# Patient Record
Sex: Male | Born: 1945 | Race: Black or African American | Hispanic: No | Smoking: Current every day smoker
Health system: Southern US, Community
[De-identification: ages and names within clinical notes are randomized; demographics above are authoritative.]

## PROBLEM LIST (undated history)

## (undated) DIAGNOSIS — R319 Hematuria, unspecified: Secondary | ICD-10-CM

## (undated) DIAGNOSIS — I82409 Acute embolism and thrombosis of unspecified deep veins of unspecified lower extremity: Secondary | ICD-10-CM

## (undated) DIAGNOSIS — I10 Essential (primary) hypertension: Secondary | ICD-10-CM

## (undated) HISTORY — DX: Essential (primary) hypertension: I10

## (undated) HISTORY — DX: Acute embolism and thrombosis of unspecified deep veins of unspecified lower extremity: I82.409

## (undated) HISTORY — DX: Hematuria, unspecified: R31.9

---

## 1996-10-04 HISTORY — PX: TOTAL KNEE ARTHROPLASTY: SHX125

## 2012-08-08 ENCOUNTER — Telehealth: Payer: Self-pay

## 2012-08-08 NOTE — Telephone Encounter (Signed)
Called and spoke to pt's daughter, Aniceto Boss, who does all of his scheduling. She said pt has family hx of colon cancer in his brother. She is going to check with him and call back to schedule the OV appt.

## 2012-08-09 NOTE — Telephone Encounter (Signed)
Pt has appt on 09/07/2012 at 1:30 PM.

## 2012-09-07 ENCOUNTER — Ambulatory Visit: Payer: Self-pay | Admitting: Gastroenterology

## 2012-10-04 HISTORY — PX: HERNIA REPAIR: SHX51

## 2013-10-04 HISTORY — PX: HERNIA REPAIR: SHX51

## 2014-10-04 HISTORY — PX: GALLBLADDER SURGERY: SHX652

## 2015-12-29 ENCOUNTER — Other Ambulatory Visit: Payer: Self-pay | Admitting: Nephrology

## 2015-12-29 DIAGNOSIS — N183 Chronic kidney disease, stage 3 unspecified: Secondary | ICD-10-CM

## 2015-12-29 DIAGNOSIS — R809 Proteinuria, unspecified: Secondary | ICD-10-CM

## 2016-01-02 ENCOUNTER — Ambulatory Visit
Admission: RE | Admit: 2016-01-02 | Discharge: 2016-01-02 | Disposition: A | Discharge status: Home / Self Care | Payer: Medicare Other | Source: Ambulatory Visit | Attending: Nephrology | Admitting: Nephrology

## 2016-01-02 DIAGNOSIS — N183 Chronic kidney disease, stage 3 unspecified: Secondary | ICD-10-CM

## 2016-01-02 DIAGNOSIS — N289 Disorder of kidney and ureter, unspecified: Secondary | ICD-10-CM | POA: Insufficient documentation

## 2016-01-02 DIAGNOSIS — R319 Hematuria, unspecified: Secondary | ICD-10-CM | POA: Diagnosis present

## 2016-01-02 DIAGNOSIS — R809 Proteinuria, unspecified: Secondary | ICD-10-CM | POA: Insufficient documentation

## 2016-03-08 ENCOUNTER — Ambulatory Visit (INDEPENDENT_AMBULATORY_CARE_PROVIDER_SITE_OTHER): Payer: Medicare Other | Admitting: Urology

## 2016-03-08 ENCOUNTER — Encounter: Payer: Self-pay | Admitting: Urology

## 2016-03-08 VITALS — BP 165/70 | HR 64 | Ht 69.0 in | Wt 169.3 lb

## 2016-03-08 DIAGNOSIS — R3129 Other microscopic hematuria: Secondary | ICD-10-CM

## 2016-03-08 DIAGNOSIS — N401 Enlarged prostate with lower urinary tract symptoms: Secondary | ICD-10-CM

## 2016-03-08 DIAGNOSIS — N138 Other obstructive and reflux uropathy: Secondary | ICD-10-CM

## 2016-03-08 NOTE — Progress Notes (Signed)
03/08/2016 3:09 PM   Jeffrey Benson 1946-04-08 [id number]  Referring provider: Abran Richard, PA-C [address] [place], [place] [address]  Chief Complaint  Patient presents with  . Hematuria    referred by Dr. in [place]    HPI: Patient is a 70 -year-old African American who presents today as a referral from their nephrologist, Dr. Lamont Dowdy, for microscopic hematuria.  Patient was found to have microscopic hematuria on 12/25/2015 with 11-30 RBC's/hpf.  Patient doesn't have a prior history of microscopic hematuria.    He does not have a prior history of recurrent urinary tract infections, nephrolithiasis, trauma to the genitourinary tract, BPH or malignancies of the genitourinary tract.   He does not have a family medical history of nephrolithiasis, malignancies of the genitourinary tract or hematuria.   Today, he is not having symptoms of frequent urination, urgency, dysuria, nocturia, incontinence, hesitancy, intermittency, straining to urinate or a weak urinary stream.   His UA today demonstrates 3-10 RBC's/hpf.    He is not experiencing any suprapubic pain, abdominal pain or flank pain.  He denies any recent fevers, chills, nausea or vomiting.   He had a renal ultrasound on 01/02/2016 which demonstrated medical renal disease and nonspecific bladder wall thickening.    He is a smoker with a 1 ppd history for 50 years.  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    PMH: Past Medical History  Diagnosis Date  . DVT of leg (deep venous thrombosis) (HCC)   . HTN (hypertension)   . Hematuria     Surgical History: Past Surgical History  Procedure Laterality Date  . Total knee arthroplasty  1998  . Hernia repair Right 2015  . Hernia repair Left 2014  . Gallbladder surgery  2016    Home Medications:    Medication List       This list is accurate as of: 03/08/16  3:09 PM.  Always use your most recent med list.               amLODipine 10 MG  tablet  Commonly known as:  NORVASC     metoprolol 100 MG tablet  Commonly known as:  LOPRESSOR     VITAMIN D PO  Take by mouth daily.     warfarin 1 MG tablet  Commonly known as:  COUMADIN  Take 5 mg by mouth.     warfarin 4 MG tablet  Commonly known as:  COUMADIN        Allergies: No Known Allergies  Family History: Family History  Problem Relation Age of Onset  . Kidney disease Neg Hx   . Prostate cancer Neg Hx     Social History:  reports that he has been smoking.  He does not have any smokeless tobacco history on file. He reports that he does not drink alcohol or use illicit drugs.  ROS: UROLOGY Frequent Urination?: No Hard to postpone urination?: No Burning/pain with urination?: No Get up at night to urinate?: No Leakage of urine?: No Urine stream starts and stops?: No Trouble starting stream?: No Do you have to strain to urinate?: No Blood in urine?: No Urinary tract infection?: No Sexually transmitted disease?: No Injury to kidneys or bladder?: No Painful intercourse?: No Weak stream?: No Erection problems?: No Penile pain?: No  Gastrointestinal Nausea?: No Vomiting?: No Indigestion/heartburn?: No Diarrhea?: No Constipation?: No  Constitutional Fever: No Night sweats?: No Weight loss?: No Fatigue?: No  Skin Skin rash/lesions?: No  Itching?: No  Eyes Blurred vision?: No Double vision?: No  Ears/Nose/Throat Sore throat?: No Sinus problems?: No  Hematologic/Lymphatic Swollen glands?: No Easy bruising?: No  Cardiovascular Leg swelling?: Yes Chest pain?: No  Respiratory Cough?: No Shortness of breath?: No  Endocrine Excessive thirst?: No  Musculoskeletal Back pain?: No Joint pain?: Yes  Neurological Headaches?: No Dizziness?: No  Psychologic Depression?: No Anxiety?: No  Physical Exam: BP 165/70 mmHg  Pulse 64  Ht 5\' 9"  (1.753 m)  Wt 169 lb 4.8 oz (76.794 kg)  BMI 24.99 kg/m2  Constitutional: Well nourished.  Alert and oriented, No acute distress. HEENT: [place] AT, moist mucus membranes. Trachea midline, no masses. Cardiovascular: No clubbing, cyanosis, or edema. Respiratory: Normal respiratory effort, no increased work of breathing. GI: Abdomen is soft, non tender, non distended, no abdominal masses. Liver and spleen not palpable.  No hernias appreciated.  Stool sample for occult testing is not indicated.   GU: No CVA tenderness.  No bladder fullness or masses.  Patient with uncircumcised phallus. Foreskin easily retracted with vitiligo.  Urethral meatus is patent.  No penile discharge. No penile lesions or rashes. Scrotum without lesions, cysts, rashes and/or edema.  Testicles are located scrotally bilaterally. No masses are appreciated in the testicles. Left and right epididymis are normal. Rectal: Patient with  normal sphincter tone. Anus and perineum without scarring or rashes. No rectal masses are appreciated. Prostate is approximately 55 grams, no nodules are appreciated. Seminal vesicles are normal. Skin: No rashes, bruises or suspicious lesions. Lymph: No cervical or inguinal adenopathy. Neurologic: Grossly intact, no focal deficits, moving all 4 extremities. Psychiatric: Normal mood and affect.  Laboratory Data: Urinalysis Results for orders placed or performed in visit on 03/08/16  Microscopic Examination  Result Value Ref Range   WBC, UA None seen 0 -  5 /hpf   RBC, UA 3-10 (A) 0 -  2 /hpf   Epithelial Cells (non renal) 0-10 0 - 10 /hpf   Bacteria, UA None seen None seen/Few  Urinalysis, Complete  Result Value Ref Range   Specific Gravity, UA >1.030 (H) 1.005 - 1.030   pH, UA 5.5 5.0 - 7.5   Color, UA Yellow Yellow   Appearance Ur Clear Clear   Leukocytes, UA Negative Negative   Protein, UA 3+ (A) Negative/Trace   Glucose, UA Negative Negative   Ketones, UA Trace (A) Negative   RBC, UA 3+ (A) Negative   Bilirubin, UA Negative Negative   Urobilinogen, Ur 0.2 0.2 - 1.0 mg/dL    Nitrite, UA Negative Negative   Microscopic Examination See below:   BUN+Creat  Result Value Ref Range   BUN 15 8 - 27 mg/dL   Creatinine, Ser 6.96 (H) 0.76 - 1.27 mg/dL   GFR calc non Af Amer 46 (L) >59 mL/min/1.73   GFR calc Af Amer 53 (L) >59 mL/min/1.73   BUN/Creatinine Ratio 10 10 - 24  PSA  Result Value Ref Range   Prostate Specific Ag, Serum 2.2 0.0 - 4.0 ng/mL    Pertinent Imaging: CLINICAL DATA: Stage III chronic kidney disease, proteinuria  EXAM: RENAL / URINARY TRACT ULTRASOUND COMPLETE  COMPARISON: None.  FINDINGS: Right Kidney:  Length: 10 cm. Increased echogenicity. No mass or hydronephrosis visualized.  Left Kidney:  Length: 10.2 cm. Increased echogenicity. No mass or hydronephrosis visualized.  Bladder:  Wall is thick at 7mm  IMPRESSION: 1. Medical renal disease  2. Nonspecific bladder wall thickening   Electronically Signed  By: Esperanza Heir M.D.  On:  01/02/2016 15:21  Assessment & Plan:    1. Microscopic hematuria:    I explained to the patient that there are a number of causes that can be associated with blood in the urine, such as stones, BPH, UTI's, damage to the urinary tract and/or cancer.  At this time, I felt that the patient warranted further urologic evaluation.   The AUA guidelines state that a CT urogram is the preferred imaging study to evaluate hematuria.  I explained to the patient that a contrast material will be injected into a vein and that in rare instances, an allergic reaction can result and may even life threatening   The patient denies any allergies to contrast, iodine and/or seafood and is not taking metformin.  On occasion, we may need to resort to a non-contrast study such as a renal ultrasound or a non-contrast CT if the patient has a contrast allergy or renal insufficiency.  In the latter case,  I told him that an upper tract study without contrast will lack the detail of excluding some urologic  tumors.  Because of this, he would need to undergo cystoscopy with bilateral retrogrades in the OR to complete the hematuria workup in addition to the imaging studies.    Following the imaging study,  I've recommended a cystoscopy. I described how this is performed, typically in an office setting with a flexible cystoscope. We described the risks, benefits, and possible side effects, the most common of which is a minor amount of blood in the urine and/or burning which usually resolves in 24 to 48 hours.   The patient had the opportunity to ask questions which were answered. Based upon this discussion, the patient is willing to proceed. Therefore, I've ordered: a CT Urogram with cystoscopy.  He will return following all of the above for discussion of the results.     - Urinalysis, Complete - CULTURE, URINE COMPREHENSIVE - BUN+Creat  2. BPH with LUTS:   Patient reports nocturia.  Will continue to monitor.  PSA is drawn today.    Return for CT Urogram report and cystoscopy.  These notes generated with voice recognition software. I apologize for typographical errors.  Michiel CowboySHANNON Kassidie Hendriks, PA-C  [facility] [address], [address] [place], [place] [address] [phone]

## 2016-03-09 ENCOUNTER — Telehealth: Payer: Self-pay

## 2016-03-09 LAB — MICROSCOPIC EXAMINATION
BACTERIA UA: NONE SEEN
WBC UA: NONE SEEN /HPF (ref 0–?)

## 2016-03-09 LAB — URINALYSIS, COMPLETE
BILIRUBIN UA: NEGATIVE
GLUCOSE, UA: NEGATIVE
LEUKOCYTES UA: NEGATIVE
Nitrite, UA: NEGATIVE
Urobilinogen, Ur: 0.2 mg/dL (ref 0.2–1.0)
pH, UA: 5.5 (ref 5.0–7.5)

## 2016-03-09 LAB — BUN+CREAT
BUN/Creatinine Ratio: 10 (ref 10–24)
BUN: 15 mg/dL (ref 8–27)
Creatinine, Ser: 1.54 mg/dL — ABNORMAL HIGH (ref 0.76–1.27)
GFR calc Af Amer: 53 mL/min/{1.73_m2} — ABNORMAL LOW (ref >59)
GFR, EST NON AFRICAN AMERICAN: 46 mL/min/{1.73_m2} — AB (ref >59)

## 2016-03-09 LAB — PSA: Prostate Specific Ag, Serum: 2.2 ng/mL (ref 0.0–4.0)

## 2016-03-09 NOTE — Telephone Encounter (Signed)
Spoke with pt daughter, Elita Quickam, in reference to PSA results. Pam voiced understanding.

## 2016-03-09 NOTE — Telephone Encounter (Signed)
-----   Message from Harle BattiestShannon A McGowan, PA-C sent at 03/09/2016 10:06 AM EDT ----- PSA is normal.

## 2016-03-09 NOTE — Telephone Encounter (Signed)
No answer

## 2016-03-10 ENCOUNTER — Telehealth: Payer: Self-pay | Admitting: Urology

## 2016-03-10 NOTE — Telephone Encounter (Signed)
Notes sent  michelle

## 2016-03-10 NOTE — Telephone Encounter (Signed)
Would you please send a copy of this office visit to the patient's referring provider, Dr. Lamont DowdySarath Kolluru?

## 2016-03-11 LAB — CULTURE, URINE COMPREHENSIVE

## 2016-03-26 ENCOUNTER — Other Ambulatory Visit: Payer: Self-pay | Admitting: Urology

## 2016-03-26 ENCOUNTER — Telehealth: Payer: Self-pay | Admitting: Urology

## 2016-03-26 DIAGNOSIS — R3129 Other microscopic hematuria: Secondary | ICD-10-CM

## 2016-03-26 NOTE — Telephone Encounter (Signed)
Done.  He needs a cystoscopy as well.

## 2016-03-26 NOTE — Telephone Encounter (Signed)
I spoke with the patient because we saw where is never got his CT scan and looking back thru it looks like the order never got placed. So I cancelled his appt for Monday and told him that we would get this fixed and call him to reschd everything.  Can you please place an order for his CT scan and I will take care of the rest.  Thank you,  Marcelino DusterMichelle

## 2016-03-29 ENCOUNTER — Other Ambulatory Visit: Payer: Medicare Other

## 2016-03-29 NOTE — Telephone Encounter (Signed)
Got him reschd for his cysto and he is calling to schd his CT scan    [place]

## 2016-04-09 ENCOUNTER — Ambulatory Visit
Admission: RE | Admit: 2016-04-09 | Discharge: 2016-04-09 | Disposition: A | Discharge status: Home / Self Care | Payer: Medicare Other | Source: Ambulatory Visit | Attending: Urology | Admitting: Urology

## 2016-04-09 DIAGNOSIS — R3129 Other microscopic hematuria: Secondary | ICD-10-CM

## 2016-04-09 DIAGNOSIS — N400 Enlarged prostate without lower urinary tract symptoms: Secondary | ICD-10-CM | POA: Insufficient documentation

## 2016-04-09 DIAGNOSIS — I700 Atherosclerosis of aorta: Secondary | ICD-10-CM | POA: Insufficient documentation

## 2016-04-09 DIAGNOSIS — N200 Calculus of kidney: Secondary | ICD-10-CM | POA: Insufficient documentation

## 2016-04-09 MED ORDER — IOPAMIDOL (ISOVUE-M 300) INJECTION 61%: 15.0000 mL | Freq: Once | INTRAMUSCULAR | Status: DC | PRN

## 2016-04-09 MED ORDER — IOPAMIDOL (ISOVUE-300) INJECTION 61%
125.0000 mL | Freq: Once | INTRAVENOUS | Status: AC | PRN
  Administered 2016-04-09: 125 mL via INTRAVENOUS

## 2016-04-16 ENCOUNTER — Encounter: Payer: Self-pay | Admitting: Urology

## 2016-04-16 ENCOUNTER — Ambulatory Visit (INDEPENDENT_AMBULATORY_CARE_PROVIDER_SITE_OTHER): Payer: Medicare Other | Admitting: Urology

## 2016-04-16 VITALS — BP 167/71 | HR 55 | Ht 68.5 in | Wt 166.8 lb

## 2016-04-16 DIAGNOSIS — Z125 Encounter for screening for malignant neoplasm of prostate: Secondary | ICD-10-CM | POA: Diagnosis not present

## 2016-04-16 DIAGNOSIS — N200 Calculus of kidney: Secondary | ICD-10-CM

## 2016-04-16 DIAGNOSIS — R3129 Other microscopic hematuria: Secondary | ICD-10-CM

## 2016-04-16 DIAGNOSIS — R911 Solitary pulmonary nodule: Secondary | ICD-10-CM | POA: Diagnosis not present

## 2016-04-16 LAB — URINALYSIS, COMPLETE
Bilirubin, UA: NEGATIVE
GLUCOSE, UA: NEGATIVE
Nitrite, UA: NEGATIVE
Urobilinogen, Ur: 1 mg/dL (ref 0.2–1.0)
pH, UA: 5.5 (ref 5.0–7.5)

## 2016-04-16 LAB — MICROSCOPIC EXAMINATION
BACTERIA UA: NONE SEEN
EPITHELIAL CELLS (NON RENAL): NONE SEEN /HPF (ref 0–10)

## 2016-04-16 MED ORDER — CIPROFLOXACIN HCL 500 MG PO TABS
500.0000 mg | ORAL_TABLET | Freq: Once | ORAL | Status: AC
  Administered 2016-04-16: 500 mg via ORAL

## 2016-04-16 MED ORDER — LIDOCAINE HCL 2 % EX GEL
1.0000 "application " | Freq: Once | CUTANEOUS | Status: AC
  Administered 2016-04-16: 1 via URETHRAL

## 2016-04-16 NOTE — Progress Notes (Signed)
04/16/2016 10:39 AM   Jeffrey Benson July 31, 1946 [id number]  Referring provider: Abran Richard, PA-C [address] [place], [place] [address]  Chief Complaint  Patient presents with  . Cysto    hematuria    HPI: Patient is a 70 -year-old [place] American who presents today as a referral from their nephrologist, Dr. Lamont Dowdy, for microscopic hematuria. Patient was found to have microscopic hematuria on 12/25/2015 with 11-30 RBC's/hpf. Patient doesn't have a prior history of microscopic hematuria.   He does not have a prior history of recurrent urinary tract infections, nephrolithiasis, trauma to the genitourinary tract, BPH or malignancies of the genitourinary tract.   He does not have a family medical history of nephrolithiasis, malignancies of the genitourinary tract or hematuria.   Today, he is not having symptoms of frequent urination, urgency, dysuria, nocturia, incontinence, hesitancy, intermittency, straining to urinate or a weak urinary stream. His UA today demonstrates 3-10 RBC's/hpf.   He is not experiencing any suprapubic pain, abdominal pain or flank pain. He denies any recent fevers, chills, nausea or vomiting.   He had a renal ultrasound on 01/02/2016 which demonstrated medical renal disease and nonspecific bladder wall thickening.   He is a smoker with a 1 ppd history for 50 years. 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   The patient presents today for cystoscopy.   This CT was significant for 3 mm nonobstructing lower pole left calculus.  He also had incidental finding of a 50 mm nodular density in his left lung base. Follow-up imaging is recommended.   PMH: Past Medical History  Diagnosis Date  . DVT of leg (deep venous thrombosis) (HCC)   . HTN (hypertension)   . Hematuria     Surgical History: Past Surgical History  Procedure Laterality Date  . Total knee arthroplasty  1998  . Hernia repair Right 2015  . Hernia repair  Left 2014  . Gallbladder surgery  2016    Home Medications:    Medication List       This list is accurate as of: 04/16/16 10:39 AM.  Always use your most recent med list.               amLODipine 10 MG tablet  Commonly known as:  NORVASC     metoprolol 100 MG tablet  Commonly known as:  LOPRESSOR     VITAMIN D PO  Take by mouth daily.     warfarin 4 MG tablet  Commonly known as:  COUMADIN     warfarin 5 MG tablet  Commonly known as:  COUMADIN        Allergies: No Known Allergies  Family History: Family History  Problem Relation Age of Onset  . Kidney disease Neg Hx   . Prostate cancer Neg Hx     Social History:  reports that he has been smoking.  He does not have any smokeless tobacco history on file. He reports that he does not drink alcohol or use illicit drugs.  ROS:                                        Physical Exam: BP 167/71 mmHg  Pulse 55  Ht 5' 8.5" (1.74 m)  Wt 166 lb 12.8 oz (75.66 kg)  BMI 24.99 kg/m2  Constitutional:  Alert and oriented, No acute distress. HEENT: [place] AT, moist mucus membranes.  Trachea midline, no masses. Cardiovascular: No clubbing, cyanosis, or edema. Respiratory: Normal respiratory effort, no increased work of breathing. GI: Abdomen is soft, nontender, nondistended, no abdominal masses GU: No CVA tenderness.  Skin: No rashes, bruises or suspicious lesions. Lymph: No cervical or inguinal adenopathy. Neurologic: Grossly intact, no focal deficits, moving all 4 extremities. Psychiatric: Normal mood and affect.  Laboratory Data: No results found for: WBC, HGB, HCT, MCV, PLT  Lab Results  Component Value Date   CREATININE 1.54* 03/08/2016    No results found for: PSA  No results found for: TESTOSTERONE  No results found for: HGBA1C  Urinalysis    Component Value Date/Time   APPEARANCEUR Clear 03/08/2016 1437   GLUCOSEU Negative 03/08/2016 1437   BILIRUBINUR Negative 03/08/2016 1437    PROTEINUR 3+* 03/08/2016 1437   NITRITE Negative 03/08/2016 1437   LEUKOCYTESUR Negative 03/08/2016 1437    Pertinent Imaging: CLINICAL DATA: Microscopic hematuria  EXAM: CT ABDOMEN AND PELVIS WITHOUT AND WITH CONTRAST  TECHNIQUE: Multidetector CT imaging of the abdomen and pelvis was performed following the standard protocol before and following the bolus administration of intravenous contrast.  CONTRAST: ISOVUE-300 IOPAMIDOL (ISOVUE-300) INJECTION 61%  COMPARISON: None  FINDINGS: Lower chest: There is a nodular density in the left base measuring 1.5 cm, image 12 of series 8. No pleural fluid identified.  Hepatobiliary: No suspicious liver abnormality. Previous cholecystectomy. No biliary dilatation.  Pancreas: No mass, inflammatory changes, or other significant abnormality.  Spleen: The spleen appears normal.  Adrenals/Urinary Tract: Normal appearance of the adrenal glands. Stone within the inferior pole of the left kidney measures 3 mm, image 29 of series 2. No hydronephrosis or hydroureter identified. No enhancing kidney lesions identified. Thick walled urinary bladder is identified. No focal filling defects identified within the urinary bladder.  Stomach/Bowel: The stomach is within normal limits. The small bowel loops have a normal course and caliber. No obstruction. Normal appearance of the colon.  Vascular/Lymphatic: Calcified atherosclerotic disease involves the abdominal aorta. No aneurysm. No enlarged retroperitoneal or mesenteric adenopathy. No enlarged pelvic or inguinal lymph nodes.  Reproductive: Prostate gland enlargement is identified. Central area of enhancement within the prostate gland is noted.  Other: There is no ascites or focal fluid collections within the abdomen or pelvis. There is a left inguinal hernia which contains fat only.  Musculoskeletal: No aggressive lytic or sclerotic bone lesions.  IMPRESSION: 1.  No acute findings identified within the abdomen or pelvis. 2. Thick walled urinary bladder is noted. Prostate gland enlargement is present. Findings may reflect chronic bladder outlet obstruction. 3. Nonobstructing left renal calculus 4. 15 mm nodular density identified in the left lung base. Consider one of the following in 3 months for both low-risk and high-risk individuals: (a) repeat chest CT, (b) follow-up PET-CT, or (c) tissue sampling. This recommendation follows the consensus statement: Guidelines for Management of Incidental Pulmonary Nodules Detected on CT Images:From the Fleischner Society 2017; published online before print (10.1148/radiol.4098119147). Item number fifth 5. Aortic atherosclerosis.     Cystoscopy Procedure Note  Patient identification was confirmed, informed consent was obtained, and patient was prepped using Betadine solution.  Lidocaine jelly was administered per urethral meatus.    Preoperative abx where received prior to procedure.     Pre-Procedure: - Inspection reveals a normal caliber ureteral meatus.  Procedure: The flexible cystoscope was introduced without difficulty - No urethral strictures/lesions are present. - Enlarged prostate, visually obstructive. Approximately 5 cm in prostatic uretheral length - Normal bladder neck - Bilateral  ureteral orifices identified - Bladder mucosa  reveals no ulcers, tumors, or lesions - No bladder stones - No trabeculation  Retroflexion shows intravesical lobe protruding into bladder   Post-Procedure: - Patient tolerated the procedure well   Assessment & Plan:    1. Left nonobstructing renal calculus -3 mm in size. No intervention indicated currently. Check KUB in one year to ensure not increasing in size  2. Microscopic hematuria -negative workup except for above.   3. Lung nodule Per the radiology report, the patient will need repeat imaging or tissue sampling. I discussed this in great  detail with the patient. He was given a copy of the CT scan report. He will discuss what the best next step is with his primary care provider at his appointment next week. He voiced an understanding.  4. Prostate cancer screening.  -Up to date. Due for DRE/PSA in June 2018  5. BPH -Asymptomatic. No treatment necessary  Return in about 1 year (around 04/16/2017) for with KUB prior.  Hildred LaserBrian James Dafne Nield, MD  [facility] [address], [address] [place], [place] [address] [phone]

## 2017-04-18 ENCOUNTER — Ambulatory Visit: Payer: Medicare Other | Admitting: Urology

## 2017-04-26 NOTE — Progress Notes (Signed)
04/27/2017 12:38 PM   Jeffrey Benson April 26, 1946 [id number]  Referring provider: Abran Richard, PA-C [address] [place], [place] [address]  Chief Complaint  Patient presents with  . Nephrolithiasis    1 year follow up   . Hematuria    HPI: 71 yo AAM with a history of hematuria, BPH with LU TS and a left renal stone who presents today for a one year follow up.  Background history Patient is a 15 -year-old African American who presents today as a referral from their nephrologist, Dr. Lamont Dowdy, for microscopic hematuria.  Patient was found to have microscopic hematuria on 12/25/2015 with 11-30 RBC's/hpf.  Patient doesn't have a prior history of microscopic hematuria.  He does not have a prior history of recurrent urinary tract infections, nephrolithiasis, trauma to the genitourinary tract, BPH or malignancies of the genitourinary tract.  He does not have a family medical history of nephrolithiasis, malignancies of the genitourinary tract or hematuria.   Today, he is not having symptoms of frequent urination, urgency, dysuria, nocturia, incontinence, hesitancy, intermittency, straining to urinate or a weak urinary stream.   His UA  demonstrates 3-10 RBC's/hpf.  He is not experiencing any suprapubic pain, abdominal pain or flank pain.  He denies any recent fevers, chills, nausea or vomiting.   He had a renal ultrasound on 01/02/2016 which demonstrated medical renal disease and nonspecific bladder wall thickening.   He is a smoker with a 1 ppd history for 50 years.  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   He underwent a hematuria work up was completed in 2017.  No malignancies were found.  Today, he is experiencing no flank pain or gross hematuria.   He is not having dysuria, gross hematuria and suprapubic pain.  He denies fevers, chills, nausea or vomiting.  His UA today is unremarkable.    BPH WITH LUTS  (prostate and/or bladder) His IPSS score today is 3, which is  mild lower urinary tract symptomatology. He is delighted with his quality life due to his urinary symptoms.     His major complaint today is nocturia x 3.  He has had these symptoms for several years.  He denies any dysuria, hematuria or suprapubic pain.   He also denies any recent fevers, chills, nausea or vomiting.   He does not have a family history of PCa.      IPSS    Row Name 04/27/17 1400         International Prostate Symptom Score   How often have you had the sensation of not emptying your bladder? Not at All     How often have you had to urinate less than every two hours? Not at All     How often have you found you stopped and started again several times when you urinated? Not at All     How often have you found it difficult to postpone urination? Not at All     How often have you had a weak urinary stream? Not at All     How often have you had to strain to start urination? Not at All     How many times did you typically get up at night to urinate? 3 Times     Total IPSS Score 3       Quality of Life due to urinary symptoms   If you were to spend the rest of your life with your urinary condition just the  way it is now how would you feel about that? Delighted        Score:  1-7 Mild 8-19 Moderate 20-35 Severe  Left renal stone No flank pain or gross hematuria.  He has not passed any stones since his last visit.      PMH: Past Medical History:  Diagnosis Date  . DVT of leg (deep venous thrombosis) (HCC)   . Hematuria   . HTN (hypertension)     Surgical History: Past Surgical History:  Procedure Laterality Date  . GALLBLADDER SURGERY  2016  . HERNIA REPAIR Right 2015  . HERNIA REPAIR Left 2014  . TOTAL KNEE ARTHROPLASTY  1998    Home Medications:  Allergies as of 04/27/2017   No Known Allergies     Medication List       Accurate as of 04/27/17 11:59 PM. Always use your most recent med list.          amLODipine 10 MG tablet Commonly known as:   NORVASC   metoprolol tartrate 100 MG tablet Commonly known as:  LOPRESSOR   VITAMIN D PO Take by mouth daily.   warfarin 1 MG tablet Commonly known as:  COUMADIN Take by mouth.   warfarin 4 MG tablet Commonly known as:  COUMADIN   warfarin 5 MG tablet Commonly known as:  COUMADIN       Allergies: No Known Allergies  Family History: Family History  Problem Relation Age of Onset  . Kidney disease Neg Hx   . Prostate cancer Neg Hx   . Bladder Cancer Neg Hx     Social History:  reports that he has been smoking.  He has never used smokeless tobacco. He reports that he does not drink alcohol or use drugs.  ROS: UROLOGY Frequent Urination?: No Hard to postpone urination?: No Burning/pain with urination?: No Get up at night to urinate?: No Leakage of urine?: No Urine stream starts and stops?: No Trouble starting stream?: No Do you have to strain to urinate?: No Blood in urine?: No Urinary tract infection?: No Sexually transmitted disease?: No Injury to kidneys or bladder?: No Painful intercourse?: No Weak stream?: No Erection problems?: No Penile pain?: No  Gastrointestinal Nausea?: No Vomiting?: No Indigestion/heartburn?: No Diarrhea?: No Constipation?: No  Constitutional Fever: No Night sweats?: No Weight loss?: No Fatigue?: No  Skin Skin rash/lesions?: No Itching?: No  Eyes Blurred vision?: No Double vision?: No  Ears/Nose/Throat Sore throat?: No Sinus problems?: No  Hematologic/Lymphatic Swollen glands?: No Easy bruising?: No  Cardiovascular Leg swelling?: No Chest pain?: No  Respiratory Cough?: No Shortness of breath?: No  Endocrine Excessive thirst?: No  Musculoskeletal Back pain?: No Joint pain?: No  Neurological Headaches?: No Dizziness?: No  Psychologic Depression?: No Anxiety?: No  Physical Exam: BP (!) 161/64   Pulse (!) 58   Ht 5\' 9"  (1.753 m)   Wt 163 lb 4.8 oz (74.1 kg)   BMI 24.12 kg/m     Constitutional: Well nourished. Alert and oriented, No acute distress. HEENT: [place] AT, moist mucus membranes. Trachea midline, no masses. Cardiovascular: No clubbing, cyanosis, or edema. Respiratory: Normal respiratory effort, no increased work of breathing. GI: Abdomen is soft, non tender, non distended, no abdominal masses. Liver and spleen not palpable.  No hernias appreciated.  Stool sample for occult testing is not indicated.   GU: No CVA tenderness.  No bladder fullness or masses.  Patient with uncircumcised phallus. Foreskin easily retracted with vitiligo.  Urethral meatus is patent.  No penile discharge. No penile lesions or rashes. Scrotum without lesions, cysts, rashes and/or edema.  Testicles are located scrotally bilaterally. No masses are appreciated in the testicles. Left and right epididymis are normal. Rectal: Patient with  normal sphincter tone. Anus and perineum without scarring or rashes. No rectal masses are appreciated. Prostate is approximately 55 grams, no nodules are appreciated. Seminal vesicles are normal. Skin: No rashes, bruises or suspicious lesions. Lymph: No cervical or inguinal adenopathy. Neurologic: Grossly intact, no focal deficits, moving all 4 extremities. Psychiatric: Normal mood and affect.  Laboratory Data: PSA History  2.2 ng/mL on 03/08/2016   Urinalysis Unremarkable.  See EPIC.   I have reviewed the labs  Pertinent Imaging: CLINICAL DATA:  Follow-up kidney stones  EXAM: ABDOMEN - 1 VIEW  COMPARISON:  04/09/2016  FINDINGS: Scattered large and small bowel gas is noted. Changes prior cholecystectomy are again seen. Diffuse arterial calcifications are noted similar to that seen on prior CT examination. Previously seen tiny left lower pole renal stone is noted and stable. No definitive ureteral stones are seen.  IMPRESSION: Tiny left lower pole renal stone stable from the prior exam.   Electronically Signed   By: Alcide Clever  M.D.   On: 04/27/2017 15:41  I have independently reviewed the films  Assessment & Plan:    1. History of hematuria  - work up completed in 2017 with CTU and cystoscopy - no GU malignancies seen  - Urinalysis, Complete - [facility]  - No report of gross hematuria  - RTC in one year for UA - patient will report any gross hematuria   2. Left renal stone  - [facility] notes stable left lower pole renal stone  3. BPH with LUTS  - IPSS score is 3/0  - Continue conservative management, avoiding bladder irritants and timed voiding's  - most bothersome symptoms is/are nocturia x 1  - RTC in 12 months for IPSS, PSA, PVR and exam    4. Pulmonary nodule  - has not followed up with PCP concerning lung nodule - instructed patient to contact PCP  - will send note and CT scan report from last year    Return in about 1 year (around 04/27/2018) for IPSS, PSA , UA and exam.  These notes generated with voice recognition software. I apologize for typographical errors.  Michiel Cowboy, PA-C  [facility] [address] [place], [place] [address] [phone]

## 2017-04-27 ENCOUNTER — Ambulatory Visit
Admission: RE | Admit: 2017-04-27 | Discharge: 2017-04-27 | Disposition: A | Discharge status: Home / Self Care | Payer: Medicare HMO | Source: Ambulatory Visit | Attending: Urology | Admitting: Urology

## 2017-04-27 ENCOUNTER — Encounter: Payer: Self-pay | Admitting: Urology

## 2017-04-27 ENCOUNTER — Ambulatory Visit (INDEPENDENT_AMBULATORY_CARE_PROVIDER_SITE_OTHER): Payer: Medicare HMO | Admitting: Urology

## 2017-04-27 VITALS — BP 161/64 | HR 58 | Ht 69.0 in | Wt 163.3 lb

## 2017-04-27 DIAGNOSIS — N200 Calculus of kidney: Secondary | ICD-10-CM | POA: Insufficient documentation

## 2017-04-27 DIAGNOSIS — N138 Other obstructive and reflux uropathy: Secondary | ICD-10-CM | POA: Diagnosis not present

## 2017-04-27 DIAGNOSIS — Z87448 Personal history of other diseases of urinary system: Secondary | ICD-10-CM | POA: Diagnosis not present

## 2017-04-27 DIAGNOSIS — N401 Enlarged prostate with lower urinary tract symptoms: Secondary | ICD-10-CM | POA: Diagnosis not present

## 2017-04-27 DIAGNOSIS — R911 Solitary pulmonary nodule: Secondary | ICD-10-CM | POA: Diagnosis not present

## 2017-04-27 NOTE — Progress Notes (Signed)
Patient's blood was drawn at 3:44pm with a butterfly needle in Right hand.

## 2017-04-28 ENCOUNTER — Telehealth: Payer: Self-pay

## 2017-04-28 LAB — URINALYSIS, COMPLETE
BILIRUBIN UA: NEGATIVE
GLUCOSE, UA: NEGATIVE
KETONES UA: NEGATIVE
Leukocytes, UA: NEGATIVE
Nitrite, UA: NEGATIVE
SPEC GRAV UA: 1.02 (ref 1.005–1.030)
Urobilinogen, Ur: 0.2 mg/dL (ref 0.2–1.0)
pH, UA: 5.5 (ref 5.0–7.5)

## 2017-04-28 LAB — PSA: Prostate Specific Ag, Serum: 3.5 ng/mL (ref 0.0–4.0)

## 2017-04-28 NOTE — Telephone Encounter (Signed)
Spoke with patient and gave him  His PSA results   Jeffrey DusterMichelle

## 2017-04-28 NOTE — Telephone Encounter (Signed)
-----   Message from Harle BattiestShannon A McGowan, PA-C sent at 04/28/2017  8:01 AM EDT ----- Please let Mr. Elza RafterJeffers know that his PSA is normal.

## 2017-04-28 NOTE — Telephone Encounter (Signed)
Called pt. Daughter Marylene Landngela answered. Not showing DPR on file. Asked daughter to have pt return call. Daughter agreed.

## 2017-05-11 IMAGING — CT CT ABD-PEL WO/W CM
2 of 4 series · 13 of 32 positions shown, 18 images · IV contrast (APPLIED)
Comparison: None

CLINICAL DATA: Microscopic hematuria

EXAM:
CT ABDOMEN AND PELVIS WITHOUT AND WITH CONTRAST
TECHNIQUE: Multidetector CT imaging of the abdomen and pelvis was performed
following the standard protocol before and following the bolus
administration of intravenous contrast.
CONTRAST:  125mL RYVS1S-HAA IOPAMIDOL (RYVS1S-HAA) INJECTION 61%

[Series 2: axial pre · axial · non-contrast · 0.70mm/px · z∈[-960,-770]mm · 5 of 87 slices shown]
[im 10/87  soft-tissue]
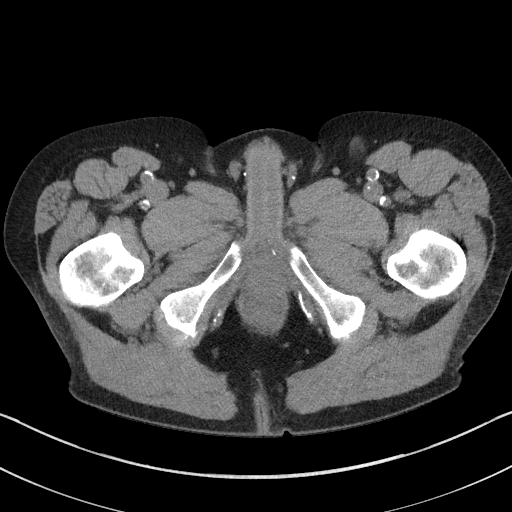
[im 20/87  soft-tissue]
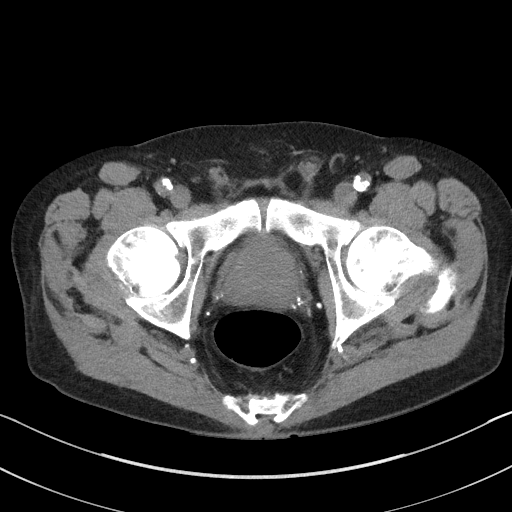
[im 29/87  soft-tissue]
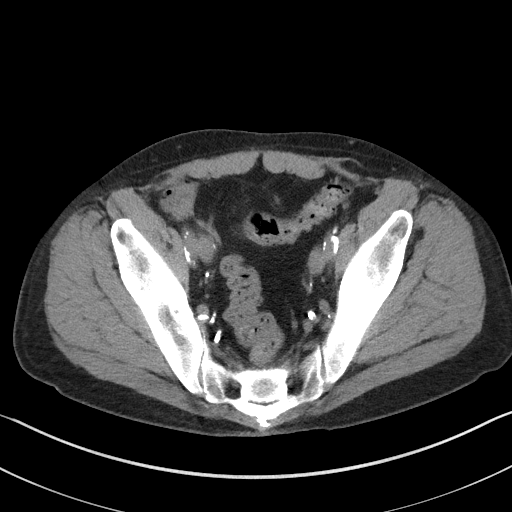
[im 39/87  soft-tissue]
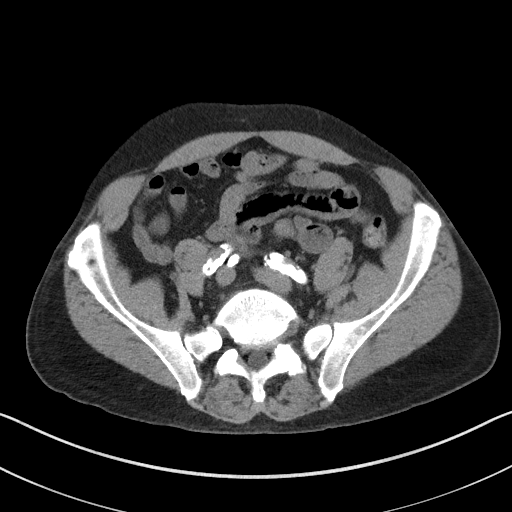
[im 48/87  soft-tissue]
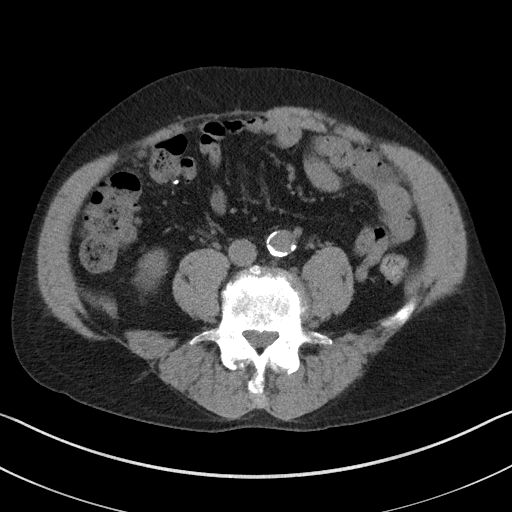

[Series 12: axial delay · axial · delayed · 0.70mm/px · z∈[-901,-536]mm · 8 of 95 slices shown, 13 images]
[im 11/95  soft-tissue]
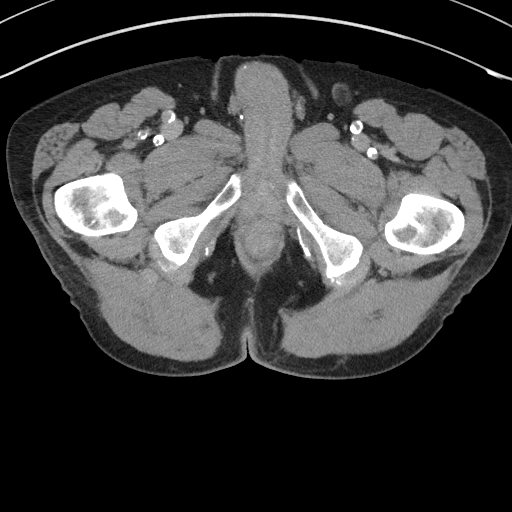
[im 11/95  bone]
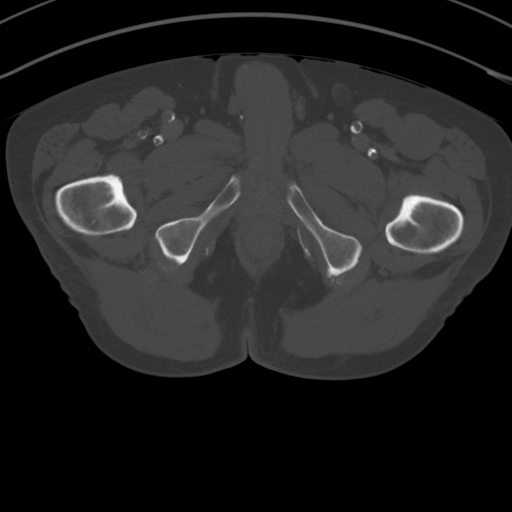
[im 21/95  soft-tissue]
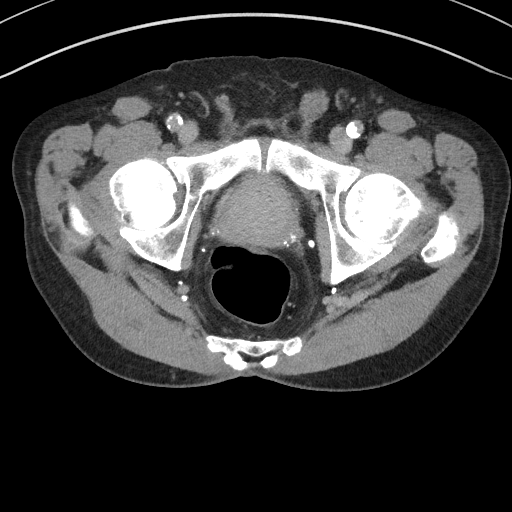
[im 32/95  soft-tissue]
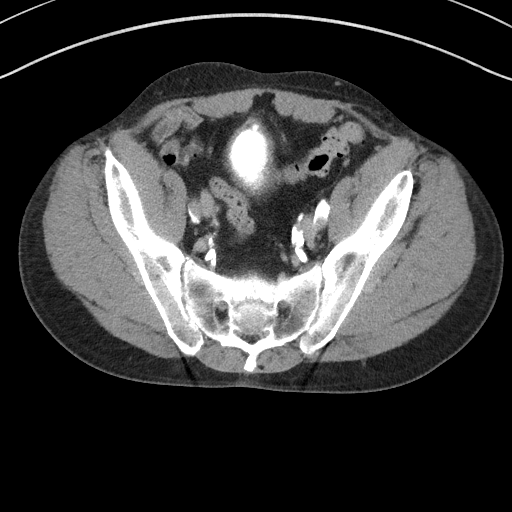
[im 42/95  soft-tissue]
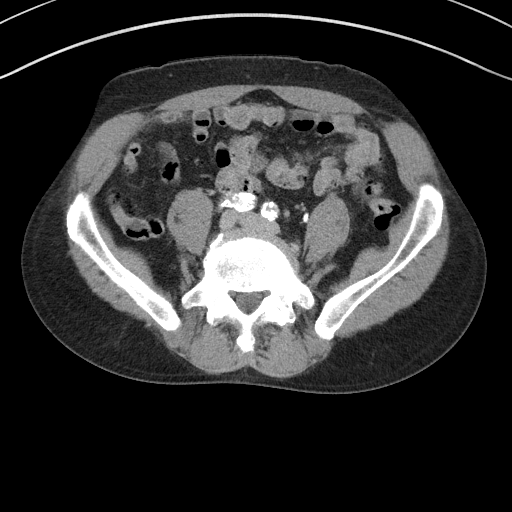
[im 53/95  soft-tissue]
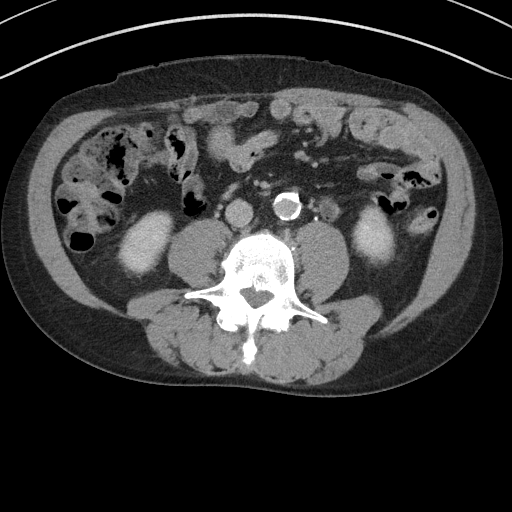
[im 53/95  lung]
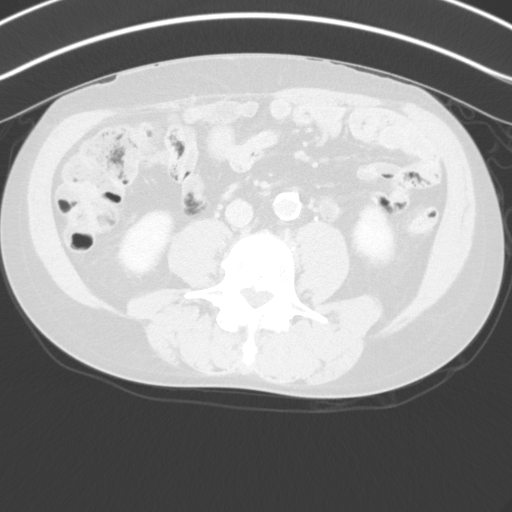
[im 63/95  soft-tissue]
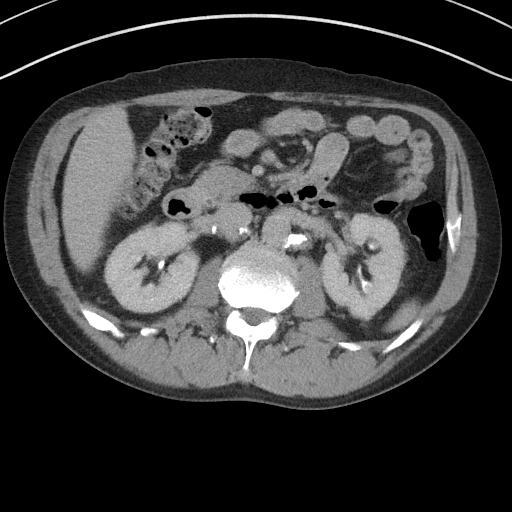
[im 63/95  lung]
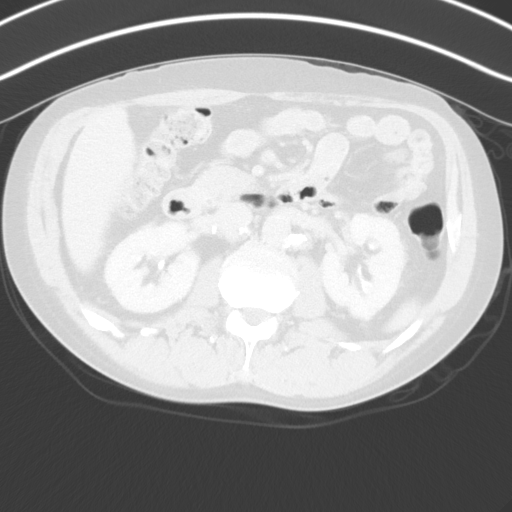
[im 74/95  soft-tissue]
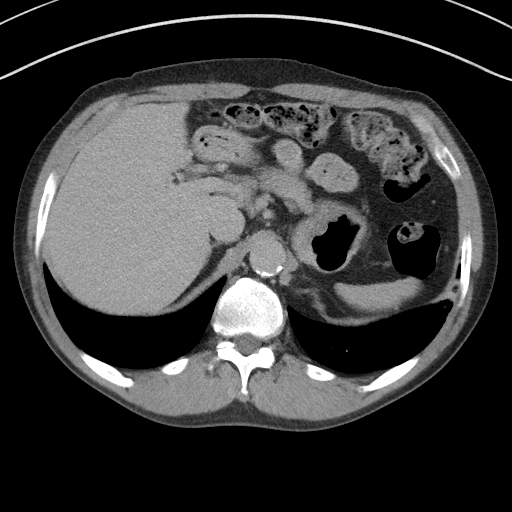
[im 74/95  lung]
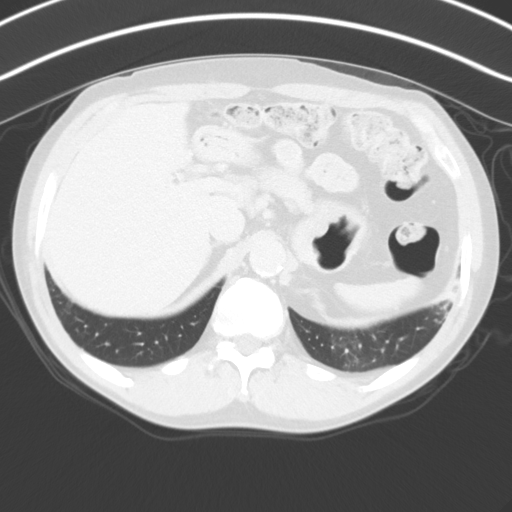
[im 84/95  soft-tissue]
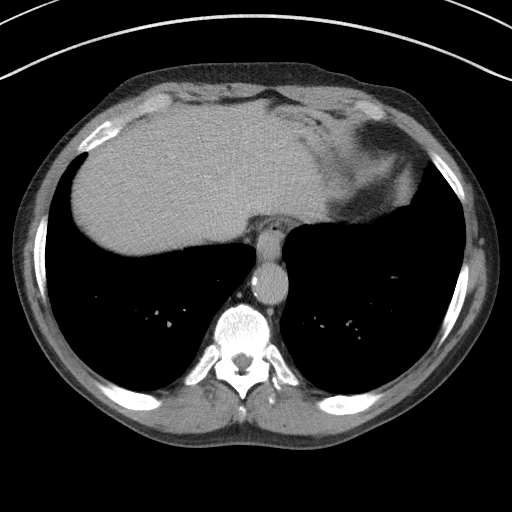
[im 84/95  lung]
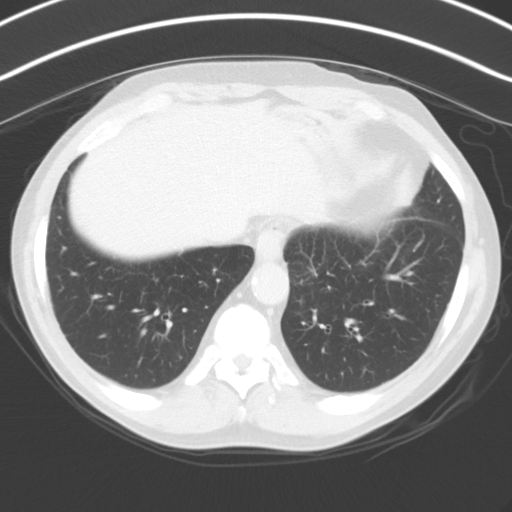

[13 of 32 positions shown; findings below may reference images not displayed]

FINDINGS: Lower chest: There is a nodular density in the left base measuring
1.5 cm, image 12 of series 8. No pleural fluid identified.

Hepatobiliary: No suspicious liver abnormality. Previous
cholecystectomy. No biliary dilatation.

Pancreas: No mass, inflammatory changes, or other significant
abnormality.

Spleen: The spleen appears normal.

Adrenals/Urinary Tract: Normal appearance of the adrenal glands.
Stone within the inferior pole of the left kidney measures 3 mm,
image 29 of series 2. No hydronephrosis or hydroureter identified.
No enhancing kidney lesions identified. Thick walled urinary bladder
is identified. No focal filling defects identified within the
urinary bladder.

Stomach/Bowel: The stomach is within normal limits. The small bowel
loops have a normal course and caliber. No obstruction. Normal
appearance of the colon.

Vascular/Lymphatic: Calcified atherosclerotic disease involves the
abdominal aorta. No aneurysm. No enlarged retroperitoneal or
mesenteric adenopathy. No enlarged pelvic or inguinal lymph nodes.

Reproductive: Prostate gland enlargement is identified. Central area
of enhancement within the prostate gland is noted.

Other: There is no ascites or focal fluid collections within the
abdomen or pelvis. There is a left inguinal hernia which contains
fat only.

Musculoskeletal:  No aggressive lytic or sclerotic bone lesions.
IMPRESSION: 1. No acute findings identified within the abdomen or pelvis.
2. Thick walled urinary bladder is noted. Prostate gland enlargement
is present. Findings may reflect chronic bladder outlet obstruction.
3. Nonobstructing left renal calculus
4. 15 mm nodular density identified in the left lung base. Consider
one of the following in 3 months for both low-risk and high-risk
individuals: (a) repeat chest CT, (b) follow-up PET-CT, or (c)
tissue sampling. This recommendation follows the consensus
statement: Guidelines for Management of Incidental Pulmonary Nodules
Detected on CT Images:From the [HOSPITAL] 7354; published
online before print (10.1148/radiol.3190949470). Item number fifth
5. Aortic atherosclerosis.

## 2018-04-20 ENCOUNTER — Other Ambulatory Visit: Payer: Medicare HMO

## 2018-04-20 ENCOUNTER — Other Ambulatory Visit: Payer: Self-pay

## 2018-04-20 ENCOUNTER — Encounter: Payer: Self-pay | Admitting: Urology

## 2018-04-20 DIAGNOSIS — N401 Enlarged prostate with lower urinary tract symptoms: Principal | ICD-10-CM

## 2018-04-20 DIAGNOSIS — N138 Other obstructive and reflux uropathy: Secondary | ICD-10-CM

## 2018-04-26 NOTE — Progress Notes (Deleted)
04/27/2018 9:15 PM   Jeffrey Benson [birth year] [id number]  Referring provider: Abran Richard, PA-C [address] [place], [place] [address]  No chief complaint on file.   HPI: 72 yo AAM with a history of hematuria, BPH with LU TS and a left renal stone who presents today for a one year follow up.  Background history Patient is a 78 -year-old African American who presents today as a referral from their nephrologist, Dr. Lamont Dowdy, for microscopic hematuria.  Patient was found to have microscopic hematuria on 12/25/2015 with 11-30 RBC's/hpf.  Patient doesn't have a prior history of microscopic hematuria.  He does not have a prior history of recurrent urinary tract infections, nephrolithiasis, trauma to the genitourinary tract, BPH or malignancies of the genitourinary tract.  He does not have a family medical history of nephrolithiasis, malignancies of the genitourinary tract or hematuria.   Today, he is not having symptoms of frequent urination, urgency, dysuria, nocturia, incontinence, hesitancy, intermittency, straining to urinate or a weak urinary stream.   His UA  demonstrates 3-10 RBC's/hpf.  He is not experiencing any suprapubic pain, abdominal pain or flank pain.  He denies any recent fevers, chills, nausea or vomiting.   He had a renal ultrasound on 01/02/2016 which demonstrated medical renal disease and nonspecific bladder wall thickening.   He is a smoker with a 1 ppd history for 50 years.  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   History of hematuria He underwent a hematuria work up was completed in 04/2016.  No malignancies were found.  He is no having gross hematuria.   His UA today is unremarkable.  He is a smoker.    BPH WITH LUTS  (prostate and/or bladder) His IPSS score today is ***, which is *** lower urinary tract symptomatology. He is ***  with his quality life due to his urinary symptoms.   His previous I PSS score was 3/0.    His major complaint  today is nocturia x 3.  He has had these symptoms for several years.  He denies any dysuria, hematuria or suprapubic pain.   He also denies any recent fevers, chills, nausea or vomiting.   He does not have a family history of PCa.    Score:  1-7 Mild 8-19 Moderate 20-35 Severe  Left renal stone No flank pain or gross hematuria.  He has not passed any stones since his last visit.      PMH: Past Medical History:  Diagnosis Date  . DVT of leg (deep venous thrombosis) (HCC)   . Hematuria   . HTN (hypertension)     Surgical History: Past Surgical History:  Procedure Laterality Date  . GALLBLADDER SURGERY  2016  . HERNIA REPAIR Right 2015  . HERNIA REPAIR Left 2014  . TOTAL KNEE ARTHROPLASTY  1998    Home Medications:  Allergies as of 04/27/2018   No Known Allergies     Medication List        Accurate as of 04/26/18  9:15 PM. Always use your most recent med list.          amLODipine 10 MG tablet Commonly known as:  NORVASC   metoprolol tartrate 100 MG tablet Commonly known as:  LOPRESSOR   VITAMIN D PO Take by mouth daily.   warfarin 1 MG tablet Commonly known as:  COUMADIN Take by mouth.   warfarin 4 MG tablet Commonly known as:  COUMADIN   warfarin 5 MG tablet  Commonly known as:  COUMADIN       Allergies: No Known Allergies  Family History: Family History  Problem Relation Age of Onset  . Kidney disease Neg Hx   . Prostate cancer Neg Hx   . Bladder Cancer Neg Hx     Social History:  reports that he has been smoking.  He has never used smokeless tobacco. He reports that he does not drink alcohol or use drugs.  ROS:                                        Physical Exam: There were no vitals taken for this visit.  Constitutional: Well nourished. Alert and oriented, No acute distress. HEENT: [place] AT, moist mucus membranes. Trachea midline, no masses. Cardiovascular: No clubbing, cyanosis, or edema. Respiratory:  Normal respiratory effort, no increased work of breathing. GI: Abdomen is soft, non tender, non distended, no abdominal masses. Liver and spleen not palpable.  No hernias appreciated.  Stool sample for occult testing is not indicated.   GU: No CVA tenderness.  No bladder fullness or masses.  Patient with uncircumcised phallus.  Foreskin easily retracted.  Vitiligo noted.  Urethral meatus is patent.  No penile discharge. No penile lesions or rashes. Scrotum without lesions, cysts, rashes and/or edema.  Testicles are located scrotally bilaterally. No masses are appreciated in the testicles. Left and right epididymis are normal. Rectal: Patient with  normal sphincter tone. Anus and perineum without scarring or rashes. No rectal masses are appreciated. Prostate is approximately 55 grams, no nodules are appreciated. Seminal vesicles are normal. Skin: No rashes, bruises or suspicious lesions. Lymph: No cervical or inguinal adenopathy. Neurologic: Grossly intact, no focal deficits, moving all 4 extremities. Psychiatric: Normal mood and affect.   Laboratory Data: PSA History  2.2 ng/mL on 03/08/2016  3.5 in 04/2017     Urinalysis *** See EPIC.   I have reviewed the labs  Pertinent Imaging: *** I have independently reviewed the films  Assessment & Plan:    1. History of hematuria  - work up completed in 2017 with CTU and cystoscopy - no GU malignancies seen  - Urinalysis, Complete ***  - No report of gross hematuria  - RTC in one year for UA - patient will report any gross hematuria in the interim   2. Left renal stone  - [facility] notes stable left lower pole renal stone ***  3. BPH with LUTS  - IPSS score is 3/0  - Continue conservative management, avoiding bladder irritants and timed voiding's  - most bothersome symptoms is/are nocturia x 1  - RTC in 12 months for IPSS, PSA, PVR and exam    4. Pulmonary nodule Patient has not had a follow up chest CT Obtain chest CT     No  follow-ups on file.  These notes generated with voice recognition software. I apologize for typographical errors.  Michiel CowboySHANNON Dutch Ing, PA-C  [facility] [address] [place], [place] [id number] [phone]

## 2018-04-27 ENCOUNTER — Ambulatory Visit: Payer: Medicare HMO | Admitting: Urology

## 2018-04-27 ENCOUNTER — Encounter: Payer: Self-pay | Admitting: Urology

## 2018-04-30 ENCOUNTER — Telehealth: Payer: Self-pay | Admitting: Urology

## 2018-04-30 ENCOUNTER — Encounter: Payer: Self-pay | Admitting: Urology

## 2018-04-30 NOTE — Telephone Encounter (Signed)
Certified letter sent 

## 2018-05-29 IMAGING — CR DG ABDOMEN 1V
1 series · 2 of 2 positions shown · non-contrast
Comparison: 04/09/2016

CLINICAL DATA: Follow-up kidney stones

EXAM:
ABDOMEN - 1 VIEW

[Series 1: dg abd 1 view · 0.14mm/px · 2 of 2 slices shown]
[im 1/2]
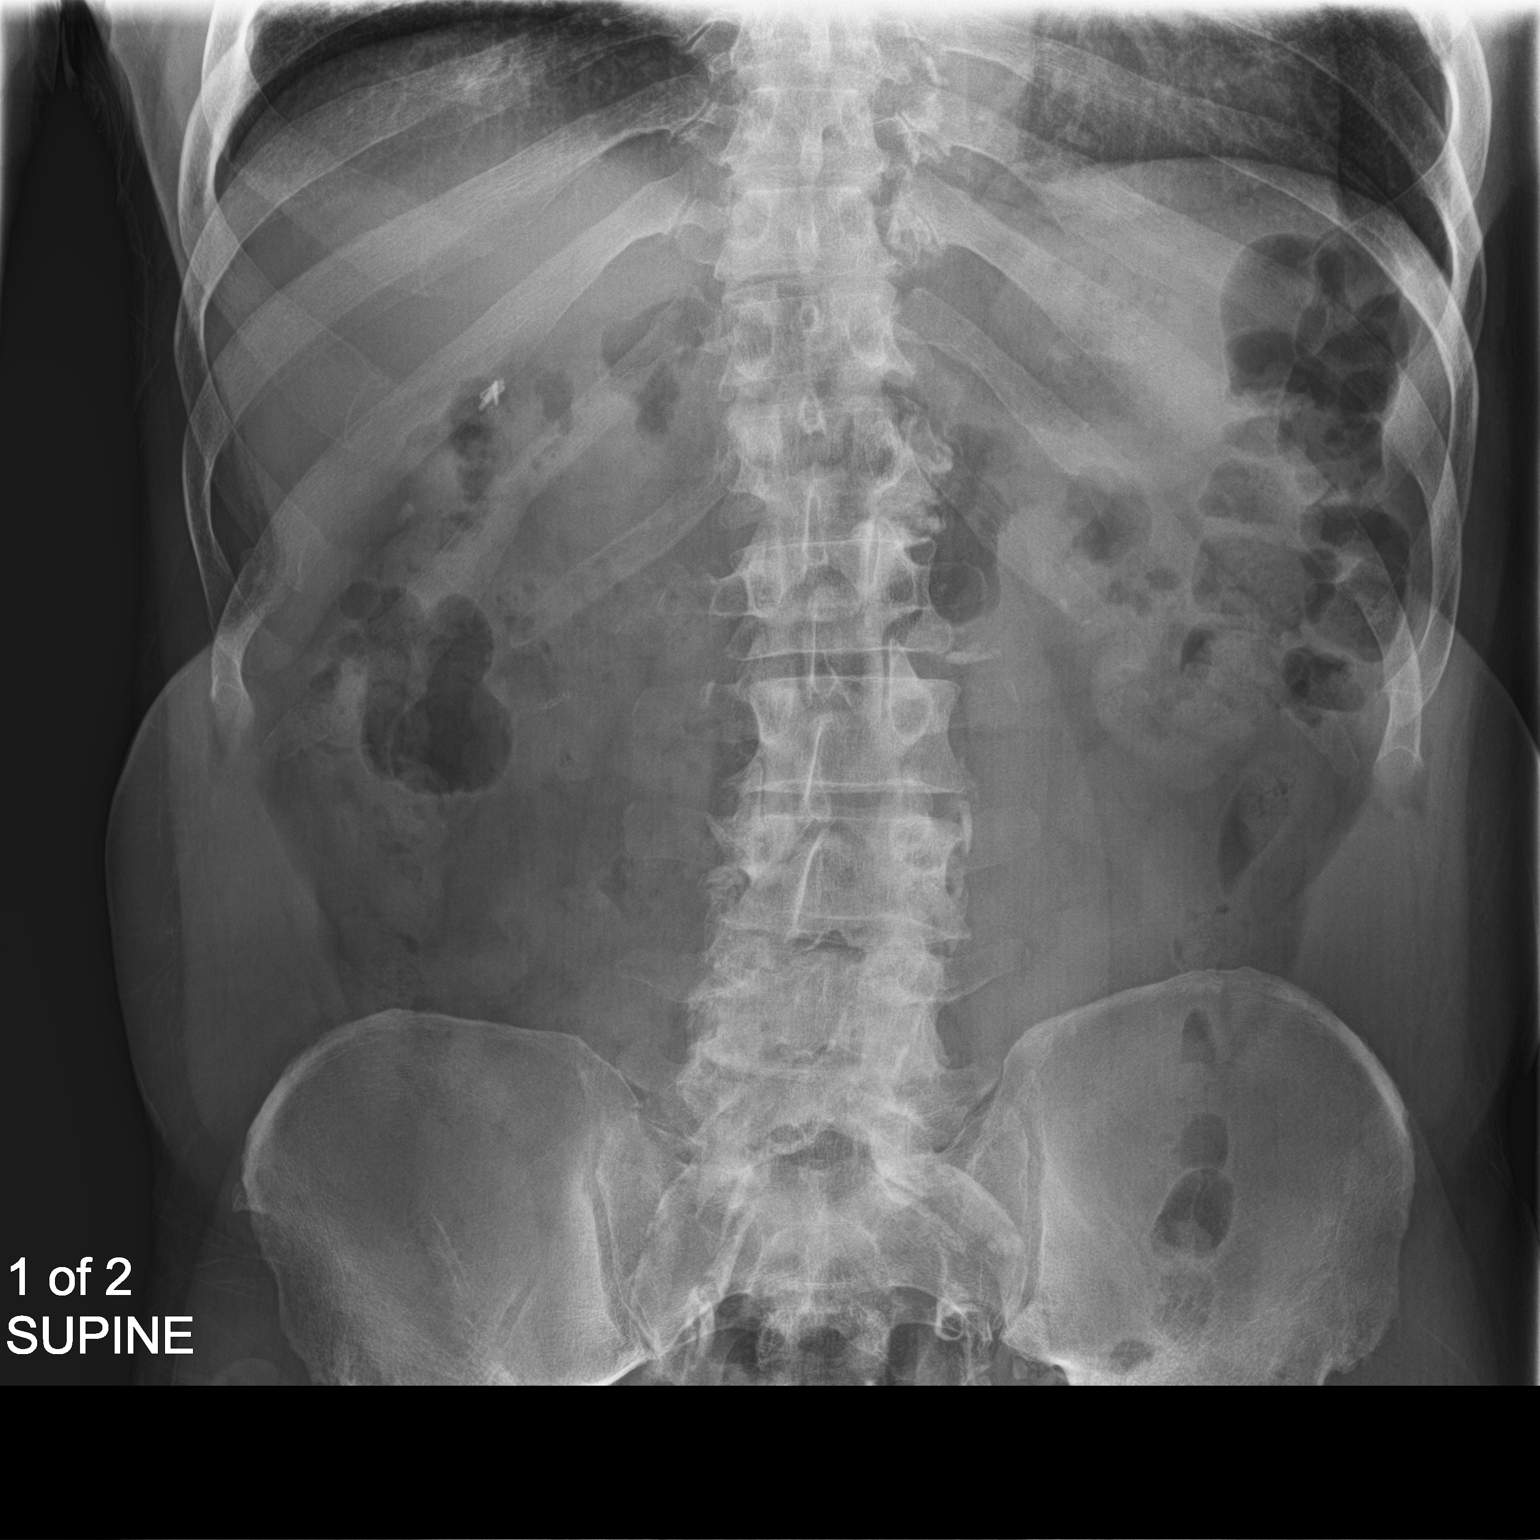
[im 2/2]
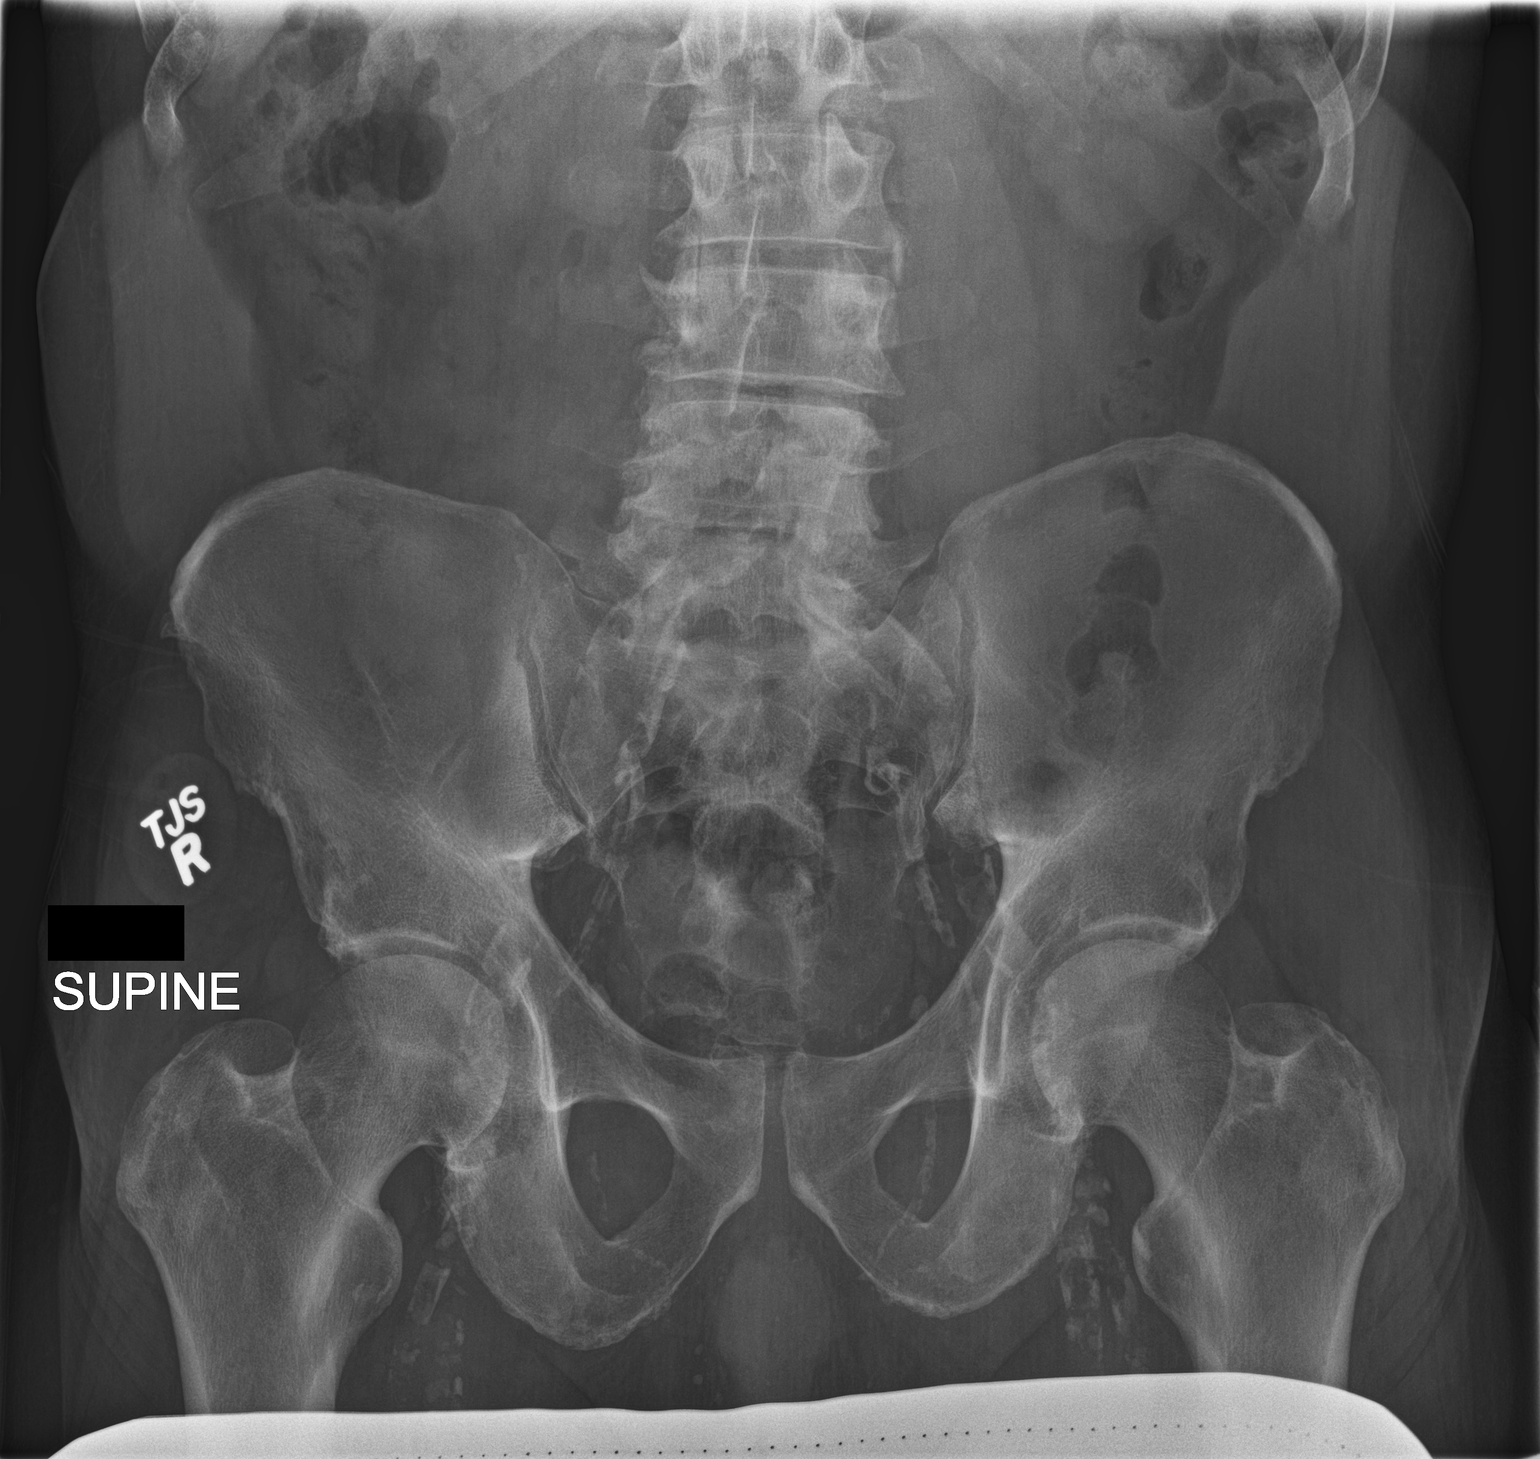

[2 of 2 positions shown; findings below may reference images not displayed]

FINDINGS: Scattered large and small bowel gas is noted. Changes prior
cholecystectomy are again seen. Diffuse arterial calcifications are
noted similar to that seen on prior CT examination. Previously seen
tiny left lower pole renal stone is noted and stable. No definitive
ureteral stones are seen.
IMPRESSION: Tiny left lower pole renal stone stable from the prior exam.

## 2020-04-11 ENCOUNTER — Other Ambulatory Visit: Payer: Self-pay | Admitting: Student

## 2020-04-11 DIAGNOSIS — N1832 Chronic kidney disease, stage 3b: Secondary | ICD-10-CM

## 2020-04-28 ENCOUNTER — Ambulatory Visit: Payer: Medicare HMO

## 2020-05-07 ENCOUNTER — Ambulatory Visit: Payer: Medicare Other

## 2020-05-13 ENCOUNTER — Ambulatory Visit: Payer: Medicare Other

## 2020-05-27 ENCOUNTER — Ambulatory Visit: Payer: Medicare Other | Attending: Student

## 2023-02-17 ENCOUNTER — Other Ambulatory Visit: Payer: Self-pay | Admitting: Internal Medicine

## 2023-02-17 DIAGNOSIS — R079 Chest pain, unspecified: Secondary | ICD-10-CM

## 2023-02-17 DIAGNOSIS — E782 Mixed hyperlipidemia: Secondary | ICD-10-CM

## 2023-02-25 ENCOUNTER — Ambulatory Visit
Admission: RE | Admit: 2023-02-25 | Discharge: 2023-02-25 | Disposition: A | Discharge status: Home / Self Care | Payer: 59 | Source: Ambulatory Visit | Attending: Internal Medicine | Admitting: Internal Medicine

## 2023-02-25 DIAGNOSIS — E782 Mixed hyperlipidemia: Secondary | ICD-10-CM

## 2023-02-25 DIAGNOSIS — R079 Chest pain, unspecified: Secondary | ICD-10-CM

## 2023-03-02 ENCOUNTER — Other Ambulatory Visit: Payer: Self-pay | Admitting: Internal Medicine

## 2023-03-02 DIAGNOSIS — I2510 Atherosclerotic heart disease of native coronary artery without angina pectoris: Secondary | ICD-10-CM

## 2023-03-28 ENCOUNTER — Telehealth (HOSPITAL_COMMUNITY): Payer: Self-pay | Admitting: *Deleted

## 2023-03-28 NOTE — Telephone Encounter (Signed)
Reaching out to patient to offer assistance regarding upcoming cardiac imaging study; pt's daughter answered phone and verbalizes understanding of appt date/time. The daughter is involved with her father's care. I asked if the patient had any updated labs, and she said no.  Informed her that we need updated labs prior to performing the CT scan. She understands with [facility] will be informed of need for labs.  Larey Brick RN Navigator Cardiac Imaging [facility] [phone] office [phone] cell

## 2023-03-30 ENCOUNTER — Telehealth (HOSPITAL_COMMUNITY): Payer: Self-pay | Admitting: *Deleted

## 2023-03-30 ENCOUNTER — Ambulatory Visit: Admission: RE | Admit: 2023-03-30 | Payer: 59 | Source: Ambulatory Visit

## 2023-03-30 NOTE — Telephone Encounter (Signed)
Calling daughter and spoke with her husband to inform them that we cannot proceed with the CCTA due to patient's kidney function.  He verbalized understanding.

## 2023-04-18 ENCOUNTER — Ambulatory Visit: Payer: 59

## 2023-10-26 ENCOUNTER — Encounter (INDEPENDENT_AMBULATORY_CARE_PROVIDER_SITE_OTHER): Payer: Self-pay

## 2023-11-17 ENCOUNTER — Encounter (INDEPENDENT_AMBULATORY_CARE_PROVIDER_SITE_OTHER): Payer: Self-pay

## 2024-02-21 ENCOUNTER — Encounter (INDEPENDENT_AMBULATORY_CARE_PROVIDER_SITE_OTHER): Payer: Self-pay
